# Patient Record
Sex: Male | Born: 2000
Health system: Southern US, Community
[De-identification: ages and names within clinical notes are randomized; demographics above are authoritative.]

## PROBLEM LIST (undated history)

## (undated) DIAGNOSIS — Z789 Other specified health status: Secondary | ICD-10-CM

## (undated) HISTORY — DX: Other specified health status: Z78.9

## (undated) HISTORY — PX: NO PAST SURGERIES: SHX2092

---
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
## 2022-10-16 ENCOUNTER — Ambulatory Visit (INDEPENDENT_AMBULATORY_CARE_PROVIDER_SITE_OTHER): Payer: Medicaid Other

## 2022-10-16 ENCOUNTER — Ambulatory Visit: Payer: Medicaid Other | Admitting: Family Medicine

## 2022-10-16 ENCOUNTER — Other Ambulatory Visit: Payer: Self-pay | Admitting: Family Medicine

## 2022-10-16 ENCOUNTER — Encounter: Payer: Self-pay | Admitting: Family Medicine

## 2022-10-16 VITALS — BP 108/64 | HR 68 | Temp 98.8°F | Ht 65.0 in | Wt 226.2 lb

## 2022-10-16 DIAGNOSIS — Z1159 Encounter for screening for other viral diseases: Secondary | ICD-10-CM | POA: Diagnosis not present

## 2022-10-16 DIAGNOSIS — R739 Hyperglycemia, unspecified: Secondary | ICD-10-CM

## 2022-10-16 DIAGNOSIS — Z0000 Encounter for general adult medical examination without abnormal findings: Secondary | ICD-10-CM

## 2022-10-16 DIAGNOSIS — Z114 Encounter for screening for human immunodeficiency virus [HIV]: Secondary | ICD-10-CM | POA: Diagnosis not present

## 2022-10-16 LAB — CBC
HCT: 41.7 % (ref 39.0–52.0)
Hemoglobin: 13.5 g/dL (ref 13.0–17.0)
MCHC: 32.4 g/dL (ref 30.0–36.0)
MCV: 84.5 fl (ref 78.0–100.0)
Platelets: 314 10*3/uL (ref 150.0–400.0)
RBC: 4.93 Mil/uL (ref 4.22–5.81)
RDW: 13.5 % (ref 11.5–15.5)
WBC: 7.5 10*3/uL (ref 4.0–10.5)

## 2022-10-16 LAB — COMPREHENSIVE METABOLIC PANEL
ALT: 22 U/L (ref 0–53)
AST: 18 U/L (ref 0–37)
Albumin: 4.5 g/dL (ref 3.5–5.2)
Alkaline Phosphatase: 77 U/L (ref 39–117)
BUN: 13 mg/dL (ref 6–23)
CO2: 27 mEq/L (ref 19–32)
Calcium: 9.4 mg/dL (ref 8.4–10.5)
Chloride: 104 mEq/L (ref 96–112)
Creatinine, Ser: 0.7 mg/dL (ref 0.40–1.50)
GFR: 130.46 mL/min (ref >60.00)
Glucose, Bld: 106 mg/dL — ABNORMAL HIGH (ref 70–99)
Potassium: 4.3 mEq/L (ref 3.5–5.1)
Sodium: 139 mEq/L (ref 135–145)
Total Bilirubin: 0.4 mg/dL (ref 0.2–1.2)
Total Protein: 7.3 g/dL (ref 6.0–8.3)

## 2022-10-16 LAB — LIPID PANEL
Cholesterol: 129 mg/dL (ref 0–200)
HDL: 32.5 mg/dL — ABNORMAL LOW (ref >39.00)
LDL Cholesterol: 81 mg/dL (ref 0–99)
NonHDL: 96.29
Total CHOL/HDL Ratio: 4
Triglycerides: 78 mg/dL (ref 0.0–149.0)
VLDL: 15.6 mg/dL (ref 0.0–40.0)

## 2022-10-16 LAB — HEMOGLOBIN A1C: Hgb A1c MFr Bld: 5.8 % (ref 4.6–6.5)

## 2022-10-16 NOTE — Progress Notes (Signed)
Chief Complaint  Patient presents with   New Patient (Initial Visit)    Well Male Jack Lynn is here for a complete physical.   His last physical was >1 year ago.  Current diet: in general, diet is overall healthy.   Current exercise: lifting wts, cardio Weight trend: intentionally losing Fatigue out of ordinary? No. Seat belt? Yes.   Advanced directive? No  Health maintenance Tetanus- Yes HIV- No Hep C- No  Past Medical History:  Diagnosis Date   No known health problems      Past Surgical History:  Procedure Laterality Date   NO PAST SURGERIES      Medications  Takes no meds routinely.   Allergies No Known Allergies  Family History Family History  Problem Relation Age of Onset   Diabetes Mother    Diabetes Paternal Grandfather    Cancer Neg Hx    Heart disease Neg Hx     Review of Systems: Constitutional: no fevers or chills Eye:  no recent significant change in vision Ear/Nose/Mouth/Throat:  Ears:  no hearing loss Nose/Mouth/Throat:  no complaints of nasal congestion, no sore throat Cardiovascular:  no chest pain Respiratory:  no shortness of breath Gastrointestinal:  no abdominal pain, no change in bowel habits GU:  Male: negative for dysuria Musculoskeletal/Extremities: +neck stiffness; otherwise no pain of the joints Integumentary (Skin/Breast):  no abnormal skin lesions reported Neurologic:  no headaches Endocrine: No unexpected weight changes Hematologic/Lymphatic:  no night sweats  Exam BP 108/64 (BP Location: Left Arm, Patient Position: Sitting, Cuff Size: Normal)   Pulse 68   Temp 98.8 F (37.1 C) (Oral)   Ht 5\' 5"  (1.651 m)   Wt 226 lb 4 oz (102.6 kg)   SpO2 96%   BMI 37.65 kg/m  General:  well developed, well nourished, in no apparent distress Skin:  no significant moles, warts, or growths Head:  no masses, lesions, or tenderness Eyes:  pupils equal and round, sclera anicteric without injection Ears:  canals  without lesions, TMs shiny without retraction, no obvious effusion, no erythema Nose:  nares patent, mucosa normal Throat/Pharynx:  lips and gingiva without lesion; tongue and uvula midline; non-inflamed pharynx; no exudates or postnasal drainage Neck: neck supple without adenopathy, thyromegaly, or masses Lungs:  clear to auscultation, breath sounds equal bilaterally, no respiratory distress Cardio:  regular rate and rhythm, no bruits, no LE edema Abdomen:  abdomen soft, nontender; bowel sounds normal; no masses or organomegaly Genital (male): Deferred Rectal: Deferred Musculoskeletal:  symmetrical muscle groups noted without atrophy or deformity Extremities:  no clubbing, cyanosis, or edema, no deformities, no skin discoloration Neuro:  gait normal; deep tendon reflexes normal and symmetric Psych: well oriented with normal range of affect and appropriate judgment/insight  Assessment and Plan  Well adult exam - Plan: CBC, Comprehensive metabolic panel, Lipid panel  Screening for HIV without presence of risk factors - Plan: HIV Antibody (routine testing w rflx)  Encounter for hepatitis C screening test for low risk patient - Plan: Hepatitis C antibody   Well 22 y.o. male. Counseled on diet and exercise. Self testicular exams recommended at least monthly.  Advanced directive form provided today.  Other orders as above. Follow up in 1 year pending the above workup. The patient voiced understanding and agreement to the plan.  Jilda Roche Malakoff, DO 10/16/22 8:48 AM

## 2022-10-16 NOTE — Patient Instructions (Addendum)
Give [place] 2-3 business days to get the results of your labs back.   Keep the diet clean and stay active.  Do monthly self testicular checks in the shower. You are feeling for lumps/bumps that don't belong. If you feel anything like this, let me know!  Please get me a copy of your advanced directive form at your convenience.   I recommend getting the flu shot in mid October. This suggestion would change if the CDC comes out with a different recommendation.   Let us know if you need anything.  Trapezius stretches/exercises Do exercises exactly as told by your health care provider and adjust them as directed. It is normal to feel mild stretching, pulling, tightness, or discomfort as you do these exercises, but you should stop right away if you feel sudden pain or your pain gets worse.   Stretching and range of motion exercises These exercises warm up your muscles and joints and improve the movement and flexibility of your shoulder. These exercises can also help to relieve pain, numbness, and tingling. If you are unable to do any of the following for any reason, do not further attempt to do it.   Exercise A: Flexion, standing     Stand and hold a broomstick, a cane, or a similar object. Place your hands a little more than shoulder-width apart on the object. Your left / right hand should be palm-up, and your other hand should be palm-down. Push the stick to raise your left / right arm out to your side and then over your head. Use your other hand to help move the stick. Stop when you feel a stretch in your shoulder, or when you reach the angle that is recommended by your health care provider. Avoid shrugging your shoulder while you raise your arm. Keep your shoulder blade tucked down toward your spine. Hold for 30 seconds. Slowly return to the starting position. Repeat 2 times. Complete this exercise 3 times per week.  Exercise B: Abduction, supine     Lie on your back and hold a broomstick, a  cane, or a similar object. Place your hands a little more than shoulder-width apart on the object. Your left / right hand should be palm-up, and your other hand should be palm-down. Push the stick to raise your left / right arm out to your side and then over your head. Use your other hand to help move the stick. Stop when you feel a stretch in your shoulder, or when you reach the angle that is recommended by your health care provider. Avoid shrugging your shoulder while you raise your arm. Keep your shoulder blade tucked down toward your spine. Hold for 30 seconds. Slowly return to the starting position. Repeat 2 times. Complete this exercise 3 times per week.  Exercise C: Flexion, active-assisted     Lie on your back. You may bend your knees for comfort. Hold a broomstick, a cane, or a similar object. Place your hands about shoulder-width apart on the object. Your palms should face toward your feet. Raise the stick and move your arms over your head and behind your head, toward the floor. Use your healthy arm to help your left / right arm move farther. Stop when you feel a gentle stretch in your shoulder, or when you reach the angle where your health care provider tells you to stop. Hold for 30 seconds. Slowly return to the starting position. Repeat 2 times. Complete this exercise 3 times per week.  Exercise  D: External rotation and abduction     Stand in a door frame with one of your feet slightly in front of the other. This is called a staggered stance. Choose one of the following positions as told by your health care provider: Place your hands and forearms on the door frame above your head. Place your hands and forearms on the door frame at the height of your head. Place your hands on the door frame at the height of your elbows. Slowly move your weight onto your front foot until you feel a stretch across your chest and in the front of your shoulders. Keep your head and chest upright and  keep your abdominal muscles tight. Hold for 30 seconds. To release the stretch, shift your weight to your back foot. Repeat 2 times. Complete this stretch 3 times per week.  Strengthening exercises These exercises build strength and endurance in your shoulder. Endurance is the ability to use your muscles for a long time, even after your muscles get tired. Exercise E: Scapular depression and adduction  Sit on a stable chair. Support your arms in front of you with pillows, armrests, or a tabletop. Keep your elbows in line with the sides of your body. Gently move your shoulder blades down toward your middle back. Relax the muscles on the tops of your shoulders and in the back of your neck. Hold for 3 seconds. Slowly release the tension and relax your muscles completely before doing this exercise again. Repeat for a total of 10 repetitions. After you have practiced this exercise, try doing the exercise without the arm support. Then, try the exercise while standing instead of sitting. Repeat 2 times. Complete this exercise 3 times per week.  Exercise F: Shoulder abduction, isometric     Stand or sit about 4-6 inches (10-15 cm) from a wall with your left / right side facing the wall. Bend your left / right elbow and gently press your elbow against the wall. Increase the pressure slowly until you are pressing as hard as you can without shrugging your shoulder. Hold for 3 seconds. Slowly release the tension and relax your muscles completely. Repeat for a total of 10 repetitions. Repeat 2 times. Complete this exercise 3 times per week.  Exercise G: Shoulder flexion, isometric     Stand or sit about 4-6 inches (10-15 cm) away from a wall with your left / right side facing the wall. Keep your left / right elbow straight and gently press the top of your fist against the wall. Increase the pressure slowly until you are pressing as hard as you can without shrugging your shoulder. Hold for 10-15  seconds. Slowly release the tension and relax your muscles completely. Repeat for a total of 10 repetitions. Repeat 2 times. Complete this exercise 3 times per week.  Exercise H: Internal rotation     Sit in a stable chair without armrests, or stand. Secure an exercise band at your left / right side, at elbow height. Place a soft object, such as a folded towel or a small pillow, under your left / right upper arm so your elbow is a few inches (about 8 cm) away from your side. Hold the end of the exercise band so the band stretches. Keeping your elbow pressed against the soft object under your arm, move your forearm across your body toward your abdomen. Keep your body steady so the movement is only coming from your shoulder. Hold for 3 seconds. Slowly return to the  starting position. Repeat for a total of 10 repetitions. Repeat 2 times. Complete this exercise 3 times per week.  Exercise I: External rotation     Sit in a stable chair without armrests, or stand. Secure an exercise band at your left / right side, at elbow height. Place a soft object, such as a folded towel or a small pillow, under your left / right upper arm so your elbow is a few inches (about 8 cm) away from your side. Hold the end of the exercise band so the band stretches. Keeping your elbow pressed against the soft object under your arm, move your forearm out, away from your abdomen. Keep your body steady so the movement is only coming from your shoulder. Hold for 3 seconds. Slowly return to the starting position. Repeat for a total of 10 repetitions. Repeat 2 times. Complete this exercise 3 times per week. Exercise J: Shoulder extension  Sit in a stable chair without armrests, or stand. Secure an exercise band to a stable object in front of you so the band is at shoulder height. Hold one end of the exercise band in each hand. Your palms should face each other. Straighten your elbows and lift your hands up to shoulder  height. Step back, away from the secured end of the exercise band, until the band stretches. Squeeze your shoulder blades together and pull your hands down to the sides of your thighs. Stop when your hands are straight down by your sides. Do not let your hands go behind your body. Hold for 3 seconds. Slowly return to the starting position. Repeat for a total of 10 repetitions. Repeat 2 times. Complete this exercise 3 times per week.  Exercise K: Shoulder extension, prone     Lie on your abdomen on a firm surface so your left / right arm hangs over the edge. Hold a 5 lb weight in your hand so your palm faces in toward your body. Your arm should be straight. Squeeze your shoulder blade down toward the middle of your back. Slowly raise your arm behind you, up to the height of the surface that you are lying on. Keep your arm straight. Hold for 3 seconds. Slowly return to the starting position and relax your muscles. Repeat for a total of 10 repetitions. Repeat 2 times. Complete this exercise 3 times per week.   Exercise L: Horizontal abduction, prone  Lie on your abdomen on a firm surface so your left / right arm hangs over the edge. Hold a 5 lb weight in your hand so your palm faces toward your feet. Your arm should be straight. Squeeze your shoulder blade down toward the middle of your back. Bend your elbow so your hand moves up, until your elbow is bent to an "L" shape (90 degrees). With your elbow bent, slowly move your forearm forward and up. Raise your hand up to the height of the surface that you are lying on. Your upper arm should not move, and your elbow should stay bent. At the top of the movement, your palm should face the floor. Hold for 3 seconds. Slowly return to the starting position and relax your muscles. Repeat for a total of 10 repetitions. Repeat 2 times. Complete this exercise 3 times per week.  Exercise M: Horizontal abduction, standing  Sit on a stable chair, or  stand. Secure an exercise band to a stable object in front of you so the band is at shoulder height. Hold one end of  the exercise band in each hand. Straighten your elbows and lift your hands straight in front of you, up to shoulder height. Your palms should face down, toward the floor. Step back, away from the secured end of the exercise band, until the band stretches. Move your arms out to your sides, and keep your arms straight. Hold for 3 seconds. Slowly return to the starting position. Repeat for a total of 10 repetitions. Repeat 2 times. Complete this exercise 3 times per week.  Exercise N: Scapular retraction and elevation  Sit on a stable chair, or stand. Secure an exercise band to a stable object in front of you so the band is at shoulder height. Hold one end of the exercise band in each hand. Your palms should face each other. Sit in a stable chair without armrests, or stand. Step back, away from the secured end of the exercise band, until the band stretches. Squeeze your shoulder blades together and lift your hands over your head. Keep your elbows straight. Hold for 3 seconds. Slowly return to the starting position. Repeat for a total of 10 repetitions. Repeat 2 times. Complete this exercise 3 times per week.  This information is not intended to replace advice given to you by your health care provider. Make sure you discuss any questions you have with your health care provider. Document Released: 01/09/2005 Document Revised: 09/16/2015 Document Reviewed: 11/26/2014 Elsevier Interactive Patient Education  2017 [employer].

## 2022-10-17 ENCOUNTER — Telehealth: Payer: Self-pay | Admitting: Family Medicine

## 2022-10-17 NOTE — Telephone Encounter (Signed)
See result notes. 

## 2022-10-17 NOTE — Telephone Encounter (Signed)
Pt called back to discuss lab results. Please advise.

## 2022-10-23 LAB — HEPATITIS C ANTIBODY: Hepatitis C Ab: NONREACTIVE

## 2022-10-23 LAB — HIV ANTIBODY (ROUTINE TESTING W REFLEX): HIV 1&2 Ab, 4th Generation: NONREACTIVE

## 2023-10-29 ENCOUNTER — Ambulatory Visit: Payer: Self-pay

## 2023-10-29 NOTE — Telephone Encounter (Signed)
 FYI Only or Action Required?: FYI only for provider.  Patient was last seen in primary care on 10/16/2022 by Frann Mabel Mt, DO.  Called Nurse Triage reporting Hemorrhoids, Rectal Pain, and Chills.  Symptoms began a week ago.  Interventions attempted: OTC medications: witch hazel, tylenol , preparation H.  Symptoms are: rectal pain and swelling/lump (size of  a grape with pink to purple discoloration), chills, nausea gradually worsening.  Triage Disposition: See PCP When Office is Open (Within 3 Days)  Patient/caregiver understands and will follow disposition?: Yes             Copied from CRM #[id number]. Topic: Clinical - Red Word Triage >> Oct 29, 2023 10:27 AM Drema MATSU wrote: Red Word that prompted transfer to Nurse Triage: Patient has hemorrhoid swelling and pain. He stated that it started Tuesday of last week, Patient also went to sleep with chills/ fever the other night.   ----------------------------------------------------------------------- From previous Reason for Contact - Scheduling: Patient/patient representative is calling to schedule an appointment. Refer to attachments for appointment information. Reason for Disposition  [1] Home treatment > 3 days for rectal pain AND [2] not improved  Answer Assessment - Initial Assessment Questions Patient declined times available with PCP due to he requested latest available appt.   1. SYMPTOM:  What's the main symptom you're concerned about? (e.g., pain, itching, swelling, rash)     Discomfort with lump/swelling (purple to pink colored; a little bigger than a grape).  2. ONSET: When did the discomfort  start?     X 1 week; worsening.  3. RECTAL PAIN: Do you have any pain around your rectum? How bad is the pain?  (Scale 0-10; or none, mild, moderate, severe)     Yes; Mild to moderate.  4. RECTAL ITCHING: Do you have any itching in this area? How bad is the itching?  (Scale 0-10; or none, mild,  moderate, severe)     No.  5. CONSTIPATION: Do you have constipation? If Yes, ask: How often do you have a bowel movement (BM)?  (Normal range: 3 times a day to every 3 days)  When was your last BM?       Yes; last BM this morning.  6. CAUSE: What do you think is causing the anus symptoms?     Hemorrhoid.  7. OTHER SYMPTOMS: Do you have any other symptoms?  (e.g., abdomen pain, fever, rectal bleeding, vomiting)     Chills, nausea. Denies rectal bleeding, rectal itching, vomiting,abdominal pain.  8. PREGNANCY: Is there any chance you are pregnant? When was your last menstrual period?     N/A.  Protocols used: Rectal Symptoms-A-AH

## 2023-10-30 ENCOUNTER — Ambulatory Visit: Payer: Self-pay | Admitting: Family Medicine
